# Patient Record
Sex: Male | Born: 1970 | Race: White | Hispanic: No | Marital: Married | Smoking: Light tobacco smoker
Health system: Southern US, Community
[De-identification: ages and names within clinical notes are randomized; demographics above are authoritative.]

---

## 1998-02-08 ENCOUNTER — Emergency Department (HOSPITAL_COMMUNITY): Admission: EM | Admit: 1998-02-08 | Discharge: 1998-02-08 | Payer: Self-pay | Admitting: Emergency Medicine

## 1998-05-15 ENCOUNTER — Emergency Department (HOSPITAL_COMMUNITY): Admission: EM | Admit: 1998-05-15 | Discharge: 1998-05-15 | Payer: Self-pay | Admitting: Emergency Medicine

## 1998-08-04 ENCOUNTER — Emergency Department (HOSPITAL_COMMUNITY): Admission: EM | Admit: 1998-08-04 | Discharge: 1998-08-04 | Payer: Self-pay | Admitting: Emergency Medicine

## 2006-03-30 ENCOUNTER — Inpatient Hospital Stay (HOSPITAL_COMMUNITY): Admission: EM | Admit: 2006-03-30 | Discharge: 2006-04-02 | Payer: Self-pay | Admitting: Emergency Medicine
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
## 2014-10-29 ENCOUNTER — Encounter (HOSPITAL_COMMUNITY): Payer: Self-pay | Admitting: Emergency Medicine

## 2014-10-29 ENCOUNTER — Emergency Department (HOSPITAL_COMMUNITY)
Admission: EM | Admit: 2014-10-29 | Discharge: 2014-10-29 | Disposition: A | Discharge status: Home / Self Care | Payer: Self-pay | Attending: Emergency Medicine | Admitting: Emergency Medicine

## 2014-10-29 DIAGNOSIS — R600 Localized edema: Secondary | ICD-10-CM | POA: Insufficient documentation

## 2014-10-29 DIAGNOSIS — F419 Anxiety disorder, unspecified: Secondary | ICD-10-CM | POA: Insufficient documentation

## 2014-10-29 DIAGNOSIS — R609 Edema, unspecified: Secondary | ICD-10-CM

## 2014-10-29 DIAGNOSIS — Z880 Allergy status to penicillin: Secondary | ICD-10-CM | POA: Insufficient documentation

## 2014-10-29 DIAGNOSIS — Z87891 Personal history of nicotine dependence: Secondary | ICD-10-CM | POA: Insufficient documentation

## 2014-10-29 LAB — CBC
HCT: 44.2 % (ref 39.0–52.0)
Hemoglobin: 15.2 g/dL (ref 13.0–17.0)
MCH: 29.9 pg (ref 26.0–34.0)
MCHC: 34.4 g/dL (ref 30.0–36.0)
MCV: 86.8 fL (ref 78.0–100.0)
PLATELETS: 261 10*3/uL (ref 150–400)
RBC: 5.09 MIL/uL (ref 4.22–5.81)
RDW: 13 % (ref 11.5–15.5)
WBC: 8.3 10*3/uL (ref 4.0–10.5)

## 2014-10-29 LAB — BASIC METABOLIC PANEL
ANION GAP: 11 (ref 5–15)
BUN: 10 mg/dL (ref 6–23)
CO2: 21 mmol/L (ref 19–32)
CREATININE: 1.34 mg/dL (ref 0.50–1.35)
Calcium: 9 mg/dL (ref 8.4–10.5)
Chloride: 104 mEq/L (ref 96–112)
GFR calc Af Amer: 75 mL/min — ABNORMAL LOW (ref >90)
GFR, EST NON AFRICAN AMERICAN: 64 mL/min — AB (ref >90)
GLUCOSE: 107 mg/dL — AB (ref 70–99)
Potassium: 3.8 mmol/L (ref 3.5–5.1)
Sodium: 136 mmol/L (ref 135–145)

## 2014-10-29 LAB — I-STAT TROPONIN, ED: Troponin i, poc: 0 ng/mL (ref 0.00–0.08)

## 2014-10-29 LAB — BRAIN NATRIURETIC PEPTIDE: B NATRIURETIC PEPTIDE 5: 27.7 pg/mL (ref 0.0–100.0)

## 2014-10-29 MED ORDER — FUROSEMIDE 20 MG PO TABS: 20.0000 mg | ORAL_TABLET | Freq: Every day | ORAL | Status: AC

## 2014-10-29 NOTE — ED Provider Notes (Signed)
CSN: [id number]     Arrival date & time 10/29/14  1853 History   First MD Initiated Contact with Patient 10/29/14 2125     Chief Complaint  Patient presents with  . Foot Swelling     (Consider location/radiation/quality/duration/timing/severity/associated sxs/prior Treatment) The history is provided by the patient and medical records. No language interpreter was used.     Phillip Moran is a 43 y.o. male  with a hx of no medical hx presents to the Emergency Department complaining of gradual, persistent, progressively worsening swelling of the bilateral feet onset yesterday morning. Patient reports that he recently started talking to a new girl within the last week. He reports that he must stand by the window to speak with her on the cell phone and therefore has spent many hours each day standing on his feet on his phone. Patient also reports that in the last week he significantly increased his salt intake because his father started buying lots of chips and he likes chips. Patient without chest pain, shortness of breath, abdominal pain, nausea, vomiting, diarrhea, weakness, dizziness, syncope.  Patient reports that he is very anxious about this and being in the emergency room makes him feel even more anxious. He asked me to make sure that everything is okay.  No aggravating or alleviating factors.    History reviewed. No pertinent past medical history. History reviewed. No pertinent past surgical history. History reviewed. No pertinent family history. History  Substance Use Topics  . Smoking status: Former Games developermoker  . Smokeless tobacco: Current User  . Alcohol Use: Not on file    Review of Systems  Constitutional: Negative for fever, diaphoresis, appetite change, fatigue and unexpected weight change.  HENT: Negative for mouth sores.   Eyes: Negative for visual disturbance.  Respiratory: Negative for cough, chest tightness, shortness of breath and wheezing.   Cardiovascular: Positive for  leg swelling (bilateral). Negative for chest pain.  Gastrointestinal: Negative for nausea, vomiting, abdominal pain, diarrhea and constipation.  Endocrine: Negative for polydipsia, polyphagia and polyuria.  Genitourinary: Negative for dysuria, urgency, frequency and hematuria.  Musculoskeletal: Negative for back pain and neck stiffness.  Skin: Negative for rash.  Allergic/Immunologic: Negative for immunocompromised state.  Neurological: Negative for syncope, light-headedness and headaches.  Hematological: Does not bruise/bleed easily.  Psychiatric/Behavioral: Negative for sleep disturbance. The patient is not nervous/anxious.       Allergies  Penicillins  Home Medications   Prior to Admission medications   Medication Sig Start Date End Date Taking? Authorizing Provider  furosemide (LASIX) 20 MG tablet Take 1 tablet (20 mg total) by mouth daily. 10/29/14   Koula Venier, PA-C   BP 135/87 mmHg  Pulse 93  Temp(Src) 98.1 F (36.7 C) (Oral)  Resp 16  Ht 6\' 1"  (1.854 m)  Wt 170 lb (77.111 kg)  BMI 22.43 kg/m2  SpO2 96% Physical Exam  Constitutional: He appears well-developed and well-nourished. No distress.  Awake, alert, nontoxic appearance  HENT:  Head: Normocephalic and atraumatic.  Mouth/Throat: Oropharynx is clear and moist. No oropharyngeal exudate.  Eyes: Conjunctivae are normal. No scleral icterus.  Neck: Normal range of motion. Neck supple.  Cardiovascular: Regular rhythm, normal heart sounds and intact distal pulses.   Pulses:      Radial pulses are 2+ on the right side, and 2+ on the left side.       Dorsalis pedis pulses are 2+ on the right side, and 2+ on the left side.  Tachycardia Unable to palpate posterior tibialis pulses  due to edema  Pulmonary/Chest: Effort normal and breath sounds normal. No respiratory distress. He has no wheezes.  Equal chest expansion  Abdominal: Soft. Bowel sounds are normal. He exhibits no mass. There is no tenderness. There is  no rebound and no guarding.  Musculoskeletal: Normal range of motion. He exhibits edema.  Nonpitting edema of the bilateral feet from the MTP joint to just above the ankle, no erythema, induration or tenderness to palpation, feet are equally swollen  Neurological: He is alert.  Speech is clear and goal oriented Moves extremities without ataxia  Skin: Skin is warm and dry. He is not diaphoretic.  Psychiatric: His mood appears anxious.  Patient very anxious about his foot swelling  Nursing note and vitals reviewed.   ED Course  Procedures (including critical care time) Labs Review Labs Reviewed  BASIC METABOLIC PANEL - Abnormal; Notable for the following:    Glucose, Bld 107 (*)    GFR calc non Af Amer 64 (*)    GFR calc Af Amer 75 (*)    All other components within normal limits  CBC  BRAIN NATRIURETIC PEPTIDE  I-STAT TROPOININ, ED    Imaging Review No results found.   EKG Interpretation   Date/Time:  Friday October 29 2014 19:23:39 EST Ventricular Rate:  117 PR Interval:  180 QRS Duration: 76 QT Interval:  334 QTC Calculation: 465 R Axis:   44 Text Interpretation:  Sinus tachycardia Otherwise normal ECG No old  tracing to compare Confirmed by Mirian MoGentry, Matthew [id number]) on 10/30/2014  3:37:43 PM        MDM   Final diagnoses:  Peripheral edema   Phillip FeyJohnny Feehan resents with peripheral edema. Patient without chest pain, shortness of breath or other signs of central fluid overload.  Patient with a normal BNP, normal troponin, normal EKG and clear and equal breath sounds.  Labs reassuring without evidence of electrolyte disturbance.  Patient reports both increased time on his feet for the last several days in addition to increasing his salt intake. I believe that the 2 of these combined have caused his peripheral edema. Patient is normotensive. Will begin on Lasix and have him follow-up with [facility] in 3-5 days.  I have personally reviewed patient's  vitals, nursing note and any pertinent labs or imaging.  I performed an undressed physical exam.    It has been determined that no acute conditions requiring further emergency intervention are present at this time. The patient/guardian have been advised of the diagnosis and plan. I reviewed all labs and imaging including any potential incidental findings. We have discussed signs and symptoms that warrant return to the ED and they are listed in the discharge instructions.    Vital signs are stable at discharge.   BP 135/87 mmHg  Pulse 93  Temp(Src) 98.1 F (36.7 C) (Oral)  Resp 16  Ht 6\' 1"  (1.854 m)  Wt 170 lb (77.111 kg)  BMI 22.43 kg/m2  SpO2 96%        Dierdre ForthHannah Aysia Lowder, PA-C 10/31/14 0101  Mirian MoMatthew Gentry, MD 11/06/14 1051

## 2014-10-29 NOTE — ED Notes (Signed)
Patient asked for and received a sprite. 

## 2014-10-29 NOTE — ED Notes (Signed)
Patient here with complaint of bilateral foot swelling starting 2 days ago. States not history of the same. Denies SOB, chest pain, nausea, vomiting, dizziness, lightheadedness. Endorses standing a lot and high amounts of salt intake.

## 2014-10-29 NOTE — Discharge Instructions (Signed)
1. Medications: lasix, usual home medications 2. Treatment: rest, drink plenty of fluids, elevate your feet 3. Follow Up: Please followup with your primary doctor in 3 days for discussion of your diagnoses and further evaluation after today's visit; if you do not have a primary care doctor use the resource guide provided to find one; Please return to the ER for chest pain, shortness of breath or other concerning symptoms    Peripheral Edema You have swelling in your legs (peripheral edema). This swelling is due to excess accumulation of salt and water in your body. Edema may be a sign of heart, kidney or liver disease, or a side effect of a medication. It may also be due to problems in the leg veins. Elevating your legs and using special support stockings may be very helpful, if the cause of the swelling is due to poor venous circulation. Avoid long periods of standing, whatever the cause. Treatment of edema depends on identifying the cause. Chips, pretzels, pickles and other salty foods should be avoided. Restricting salt in your diet is almost always needed. Water pills (diuretics) are often used to remove the excess salt and water from your body via urine. These medicines prevent the kidney from reabsorbing sodium. This increases urine flow. Diuretic treatment may also result in lowering of potassium levels in your body. Potassium supplements may be needed if you have to use diuretics daily. Daily weights can help you keep track of your progress in clearing your edema. You should call your caregiver for follow up care as recommended. SEEK IMMEDIATE MEDICAL CARE IF:   You have increased swelling, pain, redness, or heat in your legs.  You develop shortness of breath, especially when lying down.  You develop chest or abdominal pain, weakness, or fainting.  You have a fever. Document Released: 11/29/2004 Document Revised: 01/14/2012 Document Reviewed: 11/09/2009 [facility] Patient Information 2015  [employer] [place]. This information is not intended to replace advice given to you by your health care provider. Make sure you discuss any questions you have with your health care provider.

## 2018-05-20 ENCOUNTER — Other Ambulatory Visit: Payer: Self-pay

## 2018-05-20 ENCOUNTER — Emergency Department (HOSPITAL_COMMUNITY)
Admission: EM | Admit: 2018-05-20 | Discharge: 2018-05-20 | Disposition: A | Discharge status: Home / Self Care | Payer: Self-pay | Attending: Emergency Medicine | Admitting: Emergency Medicine

## 2018-05-20 ENCOUNTER — Encounter (HOSPITAL_COMMUNITY): Payer: Self-pay | Admitting: Emergency Medicine

## 2018-05-20 DIAGNOSIS — K029 Dental caries, unspecified: Secondary | ICD-10-CM | POA: Insufficient documentation

## 2018-05-20 DIAGNOSIS — Z79899 Other long term (current) drug therapy: Secondary | ICD-10-CM | POA: Insufficient documentation

## 2018-05-20 DIAGNOSIS — F17220 Nicotine dependence, chewing tobacco, uncomplicated: Secondary | ICD-10-CM | POA: Insufficient documentation

## 2018-05-20 DIAGNOSIS — K219 Gastro-esophageal reflux disease without esophagitis: Secondary | ICD-10-CM | POA: Insufficient documentation

## 2018-05-20 LAB — GROUP A STREP BY PCR: Group A Strep by PCR: NOT DETECTED

## 2018-05-20 MED ORDER — RANITIDINE HCL 150 MG PO CAPS: 150.0000 mg | ORAL_CAPSULE | Freq: Every day | ORAL | 0 refills | Status: AC

## 2018-05-20 MED ORDER — OMEPRAZOLE 40 MG PO CPDR: 40.0000 mg | DELAYED_RELEASE_CAPSULE | Freq: Every day | ORAL | 0 refills | Status: AC

## 2018-05-20 NOTE — ED Provider Notes (Signed)
[facility] Provider Note  CSN: [id number] Arrival date & time: 05/20/18  1057  History   Chief Complaint Chief Complaint  Patient presents with  . Sore Throat    HPI Phillip Moran is a 47 y.o. male with no significant medical history who presented to the ED for sore throat x3 months. He describes burning sensation that is worse after he eats or when he is lying down. He also reports worse with tomato based and spicy foods. Denies fever, chest pain, SOB, abdominal pain, N/V, changes in bowel or urinary habits. Denies upper respiratory symptoms. Patient has tried Zyrtec and omeprazole prior to coming to the ED with minimal relief.   History reviewed. No pertinent past medical history.  There are no active problems to display for this patient.   History reviewed. No pertinent surgical history.      Home Medications    Prior to Admission medications   Medication Sig Start Date End Date Taking? Authorizing Provider  furosemide (LASIX) 20 MG tablet Take 1 tablet (20 mg total) by mouth daily. 10/29/14   Muthersbaugh, Dahlia ClientHannah, PA-C  omeprazole (PRILOSEC) 40 MG capsule Take 1 capsule (40 mg total) by mouth daily. 05/20/18 06/19/18  Cassanda Walmer, Jerrel IvoryGabrielle I, PA-C  ranitidine (ZANTAC) 150 MG capsule Take 1 capsule (150 mg total) by mouth daily. 05/20/18   Mohamadou Maciver, Sharyon MedicusGabrielle I, PA-C    Family History No family history on file.  Social History Social History   Tobacco Use  . Smoking status: Light Tobacco Smoker  . Smokeless tobacco: Current User  Substance Use Topics  . Alcohol use: Not Currently  . Drug use: Never     Allergies   Penicillins   Review of Systems Review of Systems  Constitutional: Negative for activity change, appetite change and fever.  HENT: Positive for sore throat. Negative for congestion, ear pain, postnasal drip, rhinorrhea, trouble swallowing and voice change.   Respiratory: Positive for cough. Negative for chest  tightness and shortness of breath.   Cardiovascular: Negative for chest pain.  Gastrointestinal: Negative for abdominal pain, constipation, diarrhea, nausea and vomiting.     Physical Exam Updated Vital Signs BP (!) 150/98 (BP Location: Right Arm)   Pulse 94   Temp 98.3 F (36.8 C) (Oral)   Resp 18   Ht 5\' 9"  (1.753 m)   Wt 72.6 kg (160 lb)   SpO2 99%   BMI 23.63 kg/m   Physical Exam  Constitutional: He appears well-developed and well-nourished. No distress.  HENT:  Right Ear: External ear and ear canal normal.  Left Ear: External ear normal.  Mouth/Throat: Uvula is midline and mucous membranes are normal. No trismus in the jaw. Dental caries present. Posterior oropharyngeal erythema present. No oropharyngeal exudate, posterior oropharyngeal edema or tonsillar abscesses. No tonsillar exudate.  Unable to visualize TMs due to cerumen.  Neck: Normal range of motion and full passive range of motion without pain. Neck supple. No neck rigidity. Normal range of motion present.  Cardiovascular: Normal rate, regular rhythm and normal heart sounds.  Pulmonary/Chest: Effort normal and breath sounds normal.  Abdominal: Soft. Normal appearance and bowel sounds are normal. There is no tenderness.  Lymphadenopathy:    He has no cervical adenopathy.  Skin: Skin is warm and intact. No rash noted.  Nursing note and vitals reviewed.    ED Treatments / Results  Labs (all labs ordered are listed, but only abnormal results are displayed) Labs Reviewed  GROUP A STREP BY PCR  EKG None  Radiology No results found.  Procedures Procedures (including critical care time)  Medications Ordered in ED Medications - No data to display   Initial Impression / Assessment and Plan / ED Course  Triage vital signs and the nursing notes have been reviewed.  Pertinent labs & imaging results that were available during care of the patient were reviewed and considered in medical decision making (see  chart for details).   Patient presents in no acute distress and is well appearing. Patient has had 3 months of burning sensation and sore throat. Given history, this is consistent with GERD. Denies dysphagia, trismus, drooling or voice change that suggests a posterior pharynx abscess or abnormality that requires imaging. No accompanying upper respiratory, cardiac or pulmonary symptoms. No abdominal symptoms that suggests PUD or GI bleed. Given that patient has been on a PPI for 3 months without much improvement, he would likely benefit from the addition of a H2 blocker.  Final Clinical Impressions(s) / ED Diagnoses  1. GERD. Prilosec 40mg  QD and Zantac 150mg  QD prescribed for better control of symptoms. Advised to follow-up with PCP for management  Dispo: Home. After thorough clinical evaluation, this patient is determined to be medically stable and can be safely discharged with the previously mentioned treatment and/or outpatient follow-up/referral(s). At this time, there are no other apparent medical conditions that require further screening, evaluation or treatment.  Final diagnoses:  Gastroesophageal reflux disease without esophagitis    ED Discharge Orders        Ordered    ranitidine (ZANTAC) 150 MG capsule  Daily     05/20/18 1256    omeprazole (PRILOSEC) 40 MG capsule  Daily     05/20/18 [address] Marinette I, PA-C 05/20/18 1316    Derwood Kaplan, MD 05/22/18 [id number]

## 2018-05-20 NOTE — ED Triage Notes (Signed)
Pt reports burning in throat and ear. No soreness or difficulty swallowing.  Taking zyrtec and omeprazole without relief.

## 2018-05-20 NOTE — ED Notes (Signed)
Pt verbalized understanding of discharge instructions and denies any further questions at this time.   

## 2018-05-20 NOTE — Discharge Instructions (Addendum)
I have added new medications for you to try to ensure that your acid reflux is better controlled. I have also listed some information about what foods you should avoid.  You may use the GoodRx coupons I gave you to help with the cost. If the prescription is too expensive, you may ask the pharmacist about over the counter alternatives that may be more affordable.  I left a message with our case management people to give you a call about Medicaid application.  Thank you for allowing me to take care of you today!
